# Patient Record
Sex: Female | Born: 1985 | Race: White | Hispanic: No | Marital: Single | Smoking: Current every day smoker
Health system: Southern US, Community
[De-identification: ages and names within clinical notes are randomized; demographics above are authoritative.]

---
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
## 2005-04-01 ENCOUNTER — Observation Stay: Payer: Self-pay | Admitting: Obstetrics & Gynecology

## 2005-07-22 ENCOUNTER — Inpatient Hospital Stay: Payer: Self-pay | Admitting: Obstetrics & Gynecology

## 2006-05-08 ENCOUNTER — Emergency Department: Payer: Self-pay | Admitting: Emergency Medicine

## 2006-10-08 ENCOUNTER — Inpatient Hospital Stay: Payer: Self-pay | Admitting: Unknown Physician Specialty

## 2007-08-16 ENCOUNTER — Other Ambulatory Visit: Payer: Self-pay

## 2007-08-16 ENCOUNTER — Emergency Department: Payer: Self-pay | Admitting: Emergency Medicine

## 2008-04-24 ENCOUNTER — Emergency Department: Payer: Self-pay | Admitting: Emergency Medicine

## 2009-11-12 ENCOUNTER — Emergency Department: Payer: Self-pay | Admitting: Unknown Physician Specialty

## 2010-11-24 ENCOUNTER — Ambulatory Visit: Payer: Self-pay | Admitting: Internal Medicine

## 2013-07-05 IMAGING — CT CT ABD-PELV W/ CM
1 of 2 series · 15 of 32 positions shown, 19 images · non-contrast
Comparison: none

REASON FOR EXAM: abd and pelv pain
COMMENTS:

[Series 2: abd with 5.0 i40f 3 · axial · 0.75mm/px · z∈[-278,+137]mm · 15 of 91 slices shown, 19 images]
[im 4/91  soft-tissue]
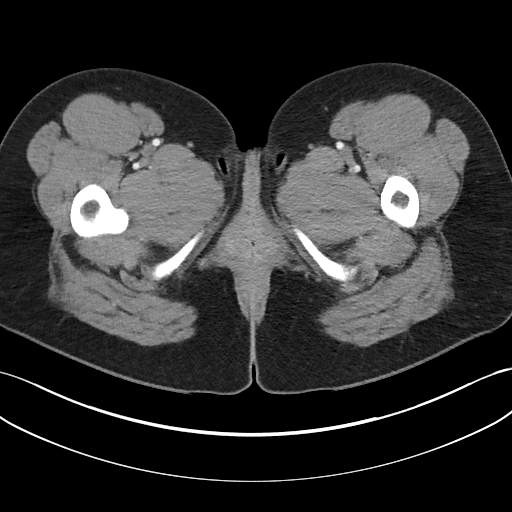
[im 4/91  bone]
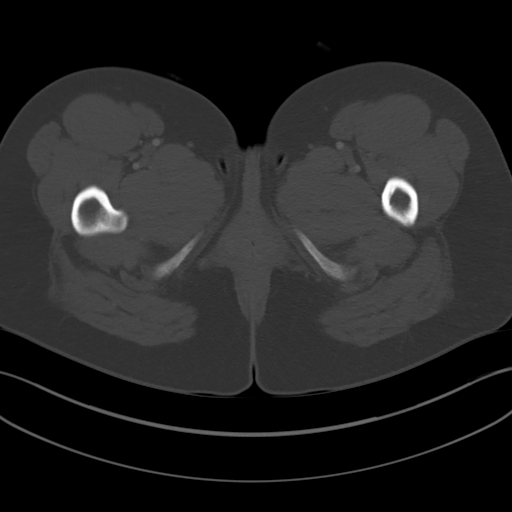
[im 11/91  soft-tissue]
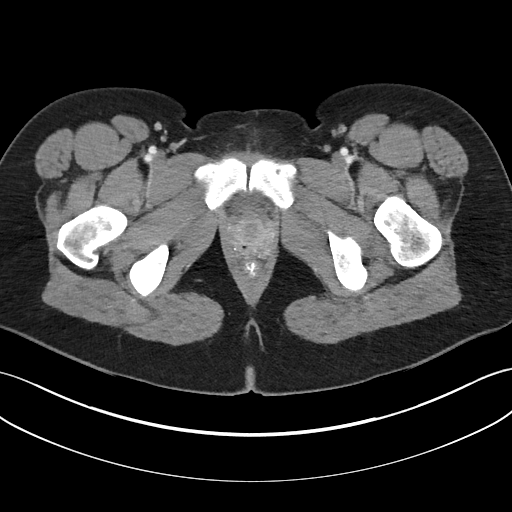
[im 19/91  soft-tissue]
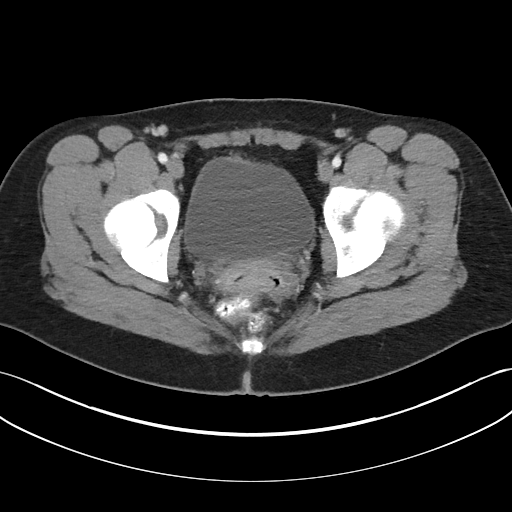
[im 26/91  soft-tissue]
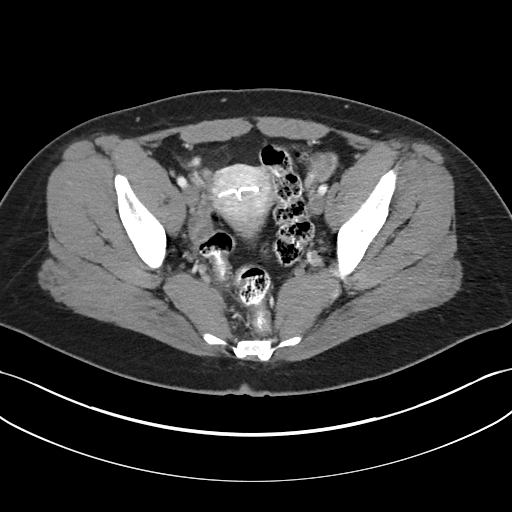
[im 33/91  soft-tissue]
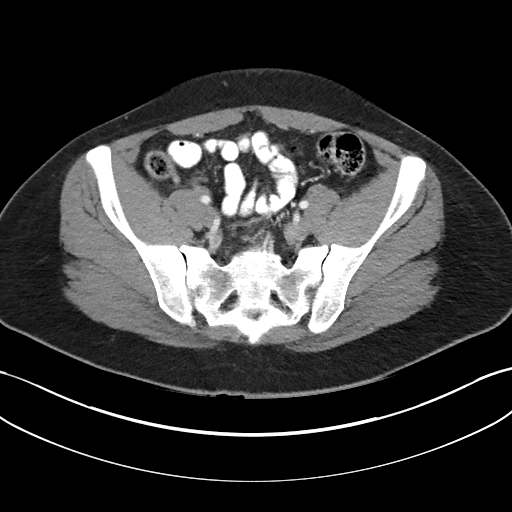
[im 40/91  soft-tissue]
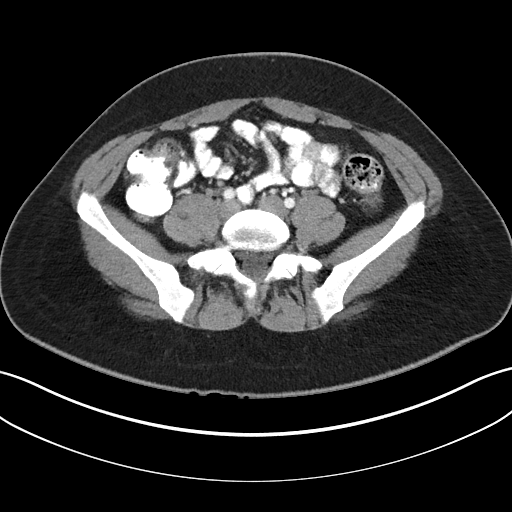
[im 47/91  soft-tissue]
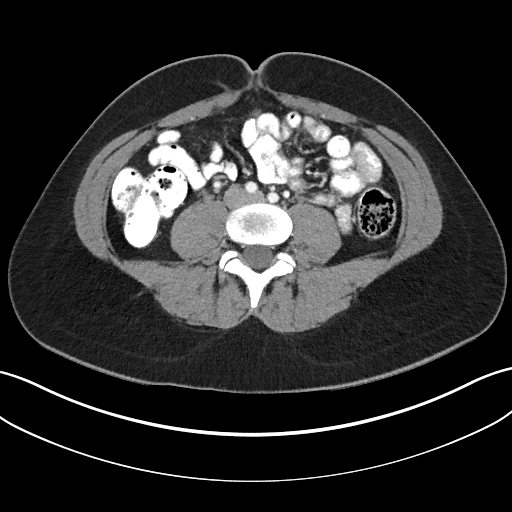
[im 51/91  soft-tissue]
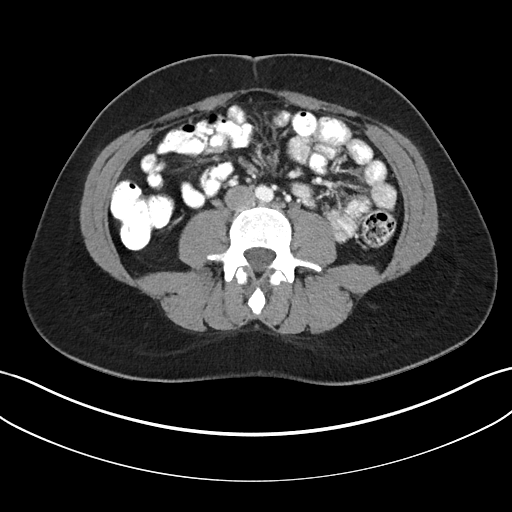
[im 58/91  soft-tissue]
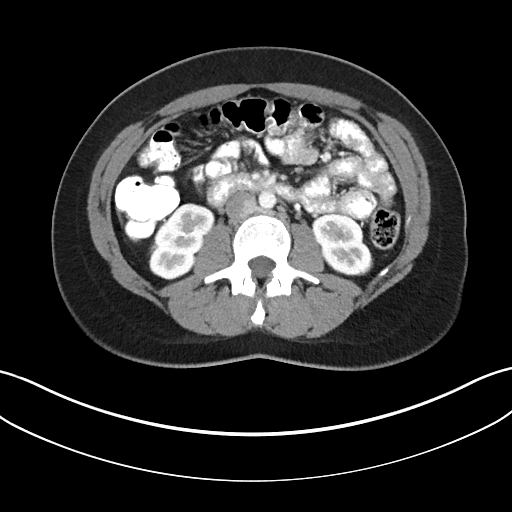
[im 58/91  bone]
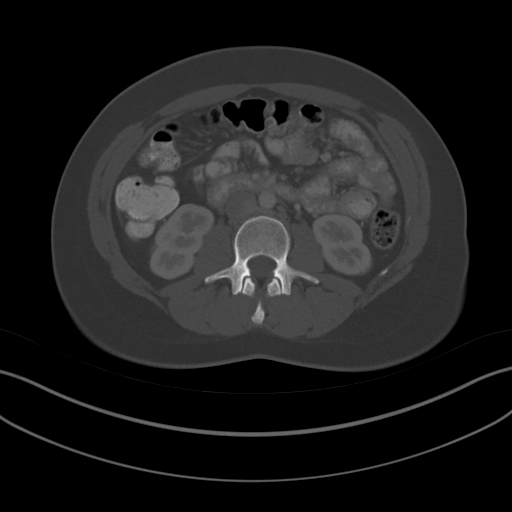
[im 65/91  soft-tissue]
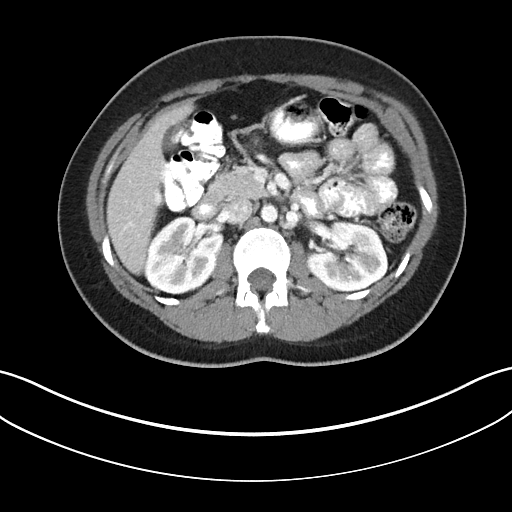
[im 73/91  soft-tissue]
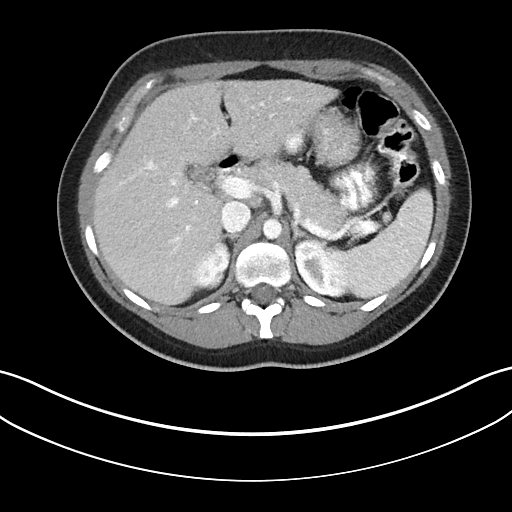
[im 76/91  lung]
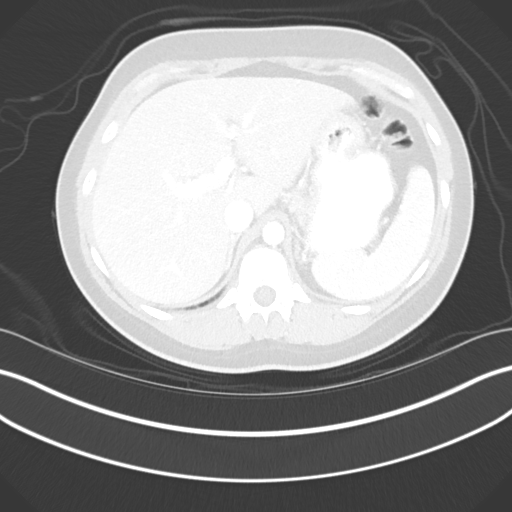
[im 80/91  soft-tissue]
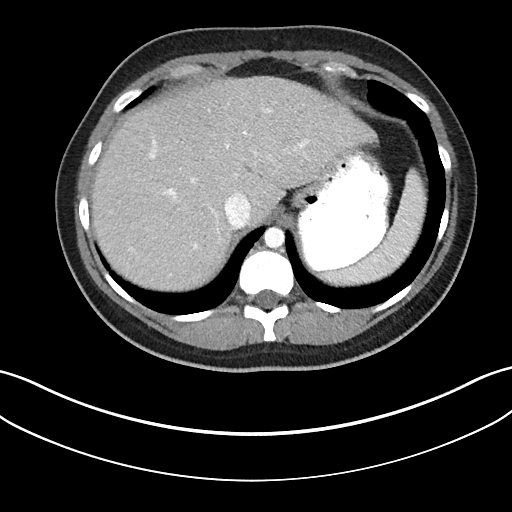
[im 80/91  lung]
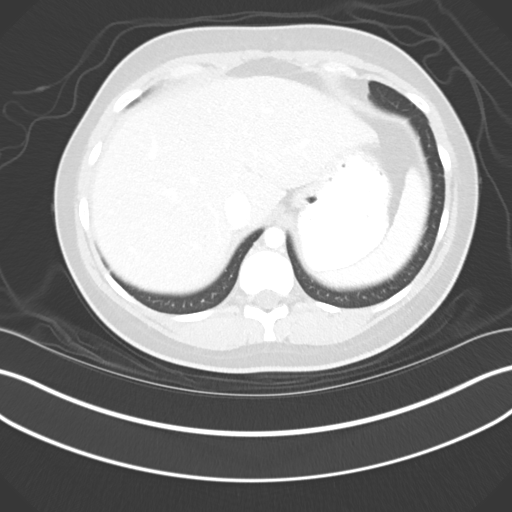
[im 83/91  lung]
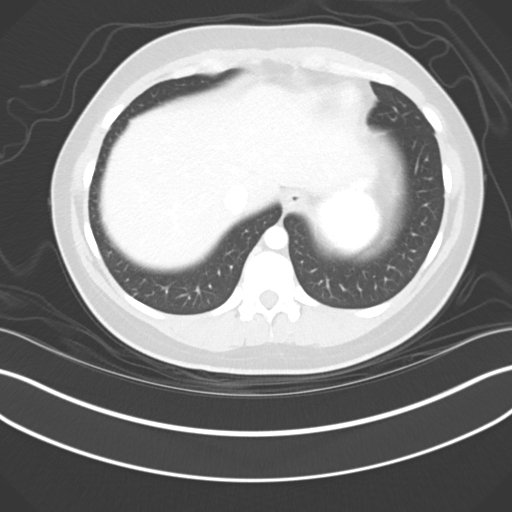
[im 87/91  soft-tissue]
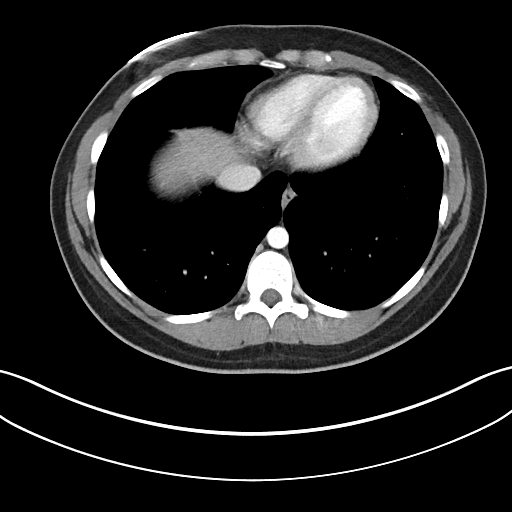
[im 87/91  lung]
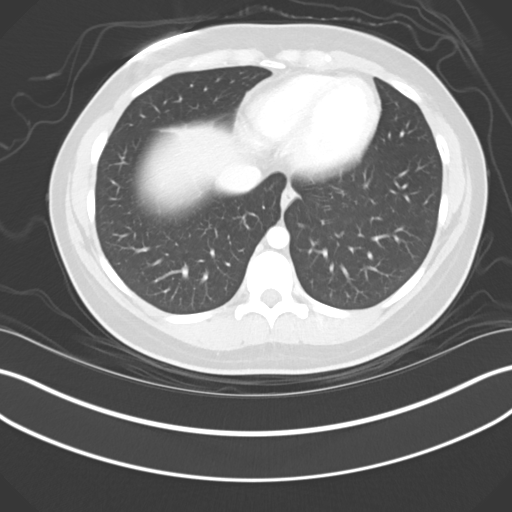

[15 of 32 positions shown; findings below may reference images not displayed]

PROCEDURE:     KCT - KCT ABDOMEN/PELVIS W  - November 24, 2010 [DATE]

RESULT:     CT of the abdomen and pelvis is performed with 85 mL of
Isovue-CMJ iodinated intravenous contrast and oral contrast. Images are
reconstructed at 5.0 mm slice thickness in the axial plane. There is no
previous exam for comparison.

Images through the base of the lungs demonstrate no discrete mass, pleural
effusion or pericardial effusion. The heart is unremarkable. The liver and
spleen appear to be within normal limits. The gallbladder shows no definite
stones. Pancreas appears unremarkable. The kidneys show no obstruction or
mass. The adrenal glands and abdominal aorta are within normal limits. There
is no abnormal bowel distention or bowel wall thickening. The uterus is
present with an intrauterine device within it. The urinary bladder is not
significantly distended. No adnexal or pelvic mass is evident. No ascites or
abnormal fluid collection or acute inflammatory process is appreciated. The
abdominal wall is intact.
IMPRESSION: 1.     No focal abnormality evident.
2.     The appendix appears normal.
3.     There is no abnormal bowel distention or wall thickening.

## 2015-09-17 ENCOUNTER — Ambulatory Visit (INDEPENDENT_AMBULATORY_CARE_PROVIDER_SITE_OTHER): Payer: BLUE CROSS/BLUE SHIELD

## 2015-09-17 ENCOUNTER — Ambulatory Visit
Admission: EM | Admit: 2015-09-17 | Discharge: 2015-09-17 | Disposition: A | Discharge status: Home / Self Care | Payer: BLUE CROSS/BLUE SHIELD | Attending: Family Medicine | Admitting: Family Medicine

## 2015-09-17 DIAGNOSIS — S9031XA Contusion of right foot, initial encounter: Secondary | ICD-10-CM

## 2015-09-17 NOTE — ED Provider Notes (Signed)
CSN: [id number]     Arrival date & time 09/17/15  1617 History   First MD Initiated Contact with Patient 09/17/15 1709     Chief Complaint  Patient presents with  . Foot Pain   (Consider location/radiation/quality/duration/timing/severity/associated sxs/prior Treatment) HPI  Is a 30 year old female who was practicing softball for a mixed league when she was struck by a line drive on the dorsum of her right foot. States that it swelled been difficult to walk on. She kept putting off being seen hoping to get better but she inadvertently dropped her phone on her foot today realizing that the pain was still very real and tender. Initial injury occurred 3 days ago. He has been using ice and elevation along with ibuprofen. He is able to walk on her foot but tends to walk on the lateral part foot    History reviewed. No pertinent past medical history. Past Surgical History:  Procedure Laterality Date  . CESAREAN SECTION     x2   No family history on file. Social History  Substance Use Topics  . Smoking status: Current Every Day Smoker  . Smokeless tobacco: Never Used  . Alcohol use No   OB History    No data available     Review of Systems  Constitutional: Positive for activity change. Negative for chills, fatigue and fever.  Skin: Negative for color change and wound.  All other systems reviewed and are negative.   Allergies  Review of patient's allergies indicates no known allergies.  Home Medications   Prior to Admission medications   Not on File   Meds Ordered and Administered this Visit  Medications - No data to display  BP 116/65 (BP Location: Left Arm)   Pulse 82   Temp 98.2 F (36.8 C) (Oral)   Resp 16   Ht 5\' 4"  (1.626 m)   Wt 180 lb (81.6 kg)   LMP 08/11/2015 (Exact Date) Comment: nuva ring  SpO2 99%   BMI 30.90 kg/m  No data found.   Physical Exam  Constitutional: She is oriented to person, place, and time. She appears well-developed and  well-nourished. No distress.  HENT:  Head: Normocephalic and atraumatic.  Eyes: EOM are normal. Pupils are equal, round, and reactive to light.  Neck: Normal range of motion. Neck supple.  Musculoskeletal: She exhibits tenderness. She exhibits no edema or deformity.  Examination the right foot shows no significant swelling erythema or ecchymosis. The patient has exquisite  tenderness to light touch along the first second and third metatarsals from the metatarsal tarsal border to approximately mid foot. Sensation is intact to light touch. Ankle range of motion is intact fully as are the toe range of motion  Neurological: She is alert and oriented to person, place, and time. She has normal reflexes.  Skin: Skin is warm and dry. She is not diaphoretic.  Psychiatric: She has a normal mood and affect. Her behavior is normal. Judgment and thought content normal.  Nursing note and vitals reviewed.   Urgent Care Course   Clinical Course    Procedures (including critical care time)  Labs Review Labs Reviewed - No data to display  Imaging Review Dg Foot 2 Views Right  Result Date: 09/17/2015 CLINICAL DATA:  Status post trauma to the anterior medial portion of the right foot with pain. EXAM: RIGHT FOOT - 2 VIEW COMPARISON:  None. FINDINGS: There is no evidence of fracture or dislocation. There is no evidence of arthropathy or other focal bone  abnormality. Soft tissues are unremarkable. IMPRESSION: Negative. Electronically Signed   By: Ted Mcalpine M.D.   On: 09/17/2015 16:56    Visual Acuity Review  Right Eye Distance:   Left Eye Distance:   Bilateral Distance:    Right Eye Near:   Left Eye Near:    Bilateral Near:      She was given a boot orthosis   MDM   1. Foot contusion, right, initial encounter    New Prescriptions   No medications on file  Plan: 1. Test/x-ray results and diagnosis reviewed with patient 2. rx as per orders; risks, benefits, potential side effects  reviewed with patient 3. Recommend supportive treatment with Elevation and heat and ibuprofen as necessary for comfort. She can use her boot for comfort. 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 If she continues to have problems she should follow-up with her primary care physician 4. F/u prn if symptoms worsen or don't improve     Lutricia Feil, PA-C 09/17/15 1745

## 2015-09-17 NOTE — ED Triage Notes (Signed)
Pt states she was hit on the medial side of foot on Tuesday with a softball and has had pain and swelling since.Marland Kitchen

## 2015-09-17 NOTE — ED Notes (Signed)
Pt returned from xray

## 2018-04-28 IMAGING — CR DG FOOT 2V*R*
2 series · 2 of 2 positions shown · non-contrast
Comparison: None.

CLINICAL DATA: Status post trauma to the anterior medial portion of
the right foot with pain.

EXAM:
RIGHT FOOT - 2 VIEW

[foot ap]
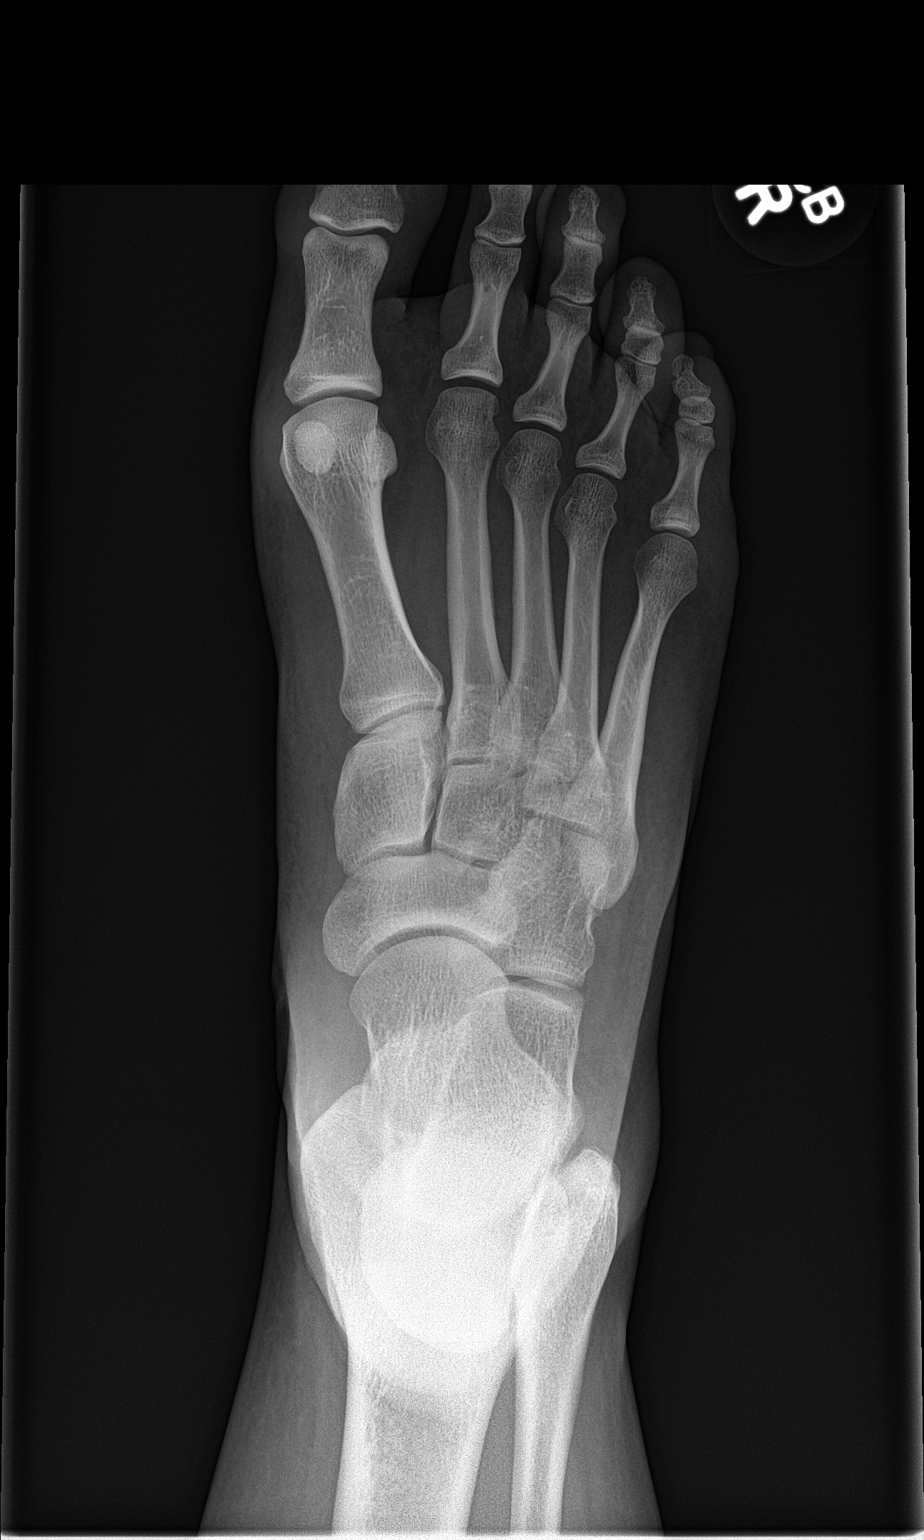

[foot lat]
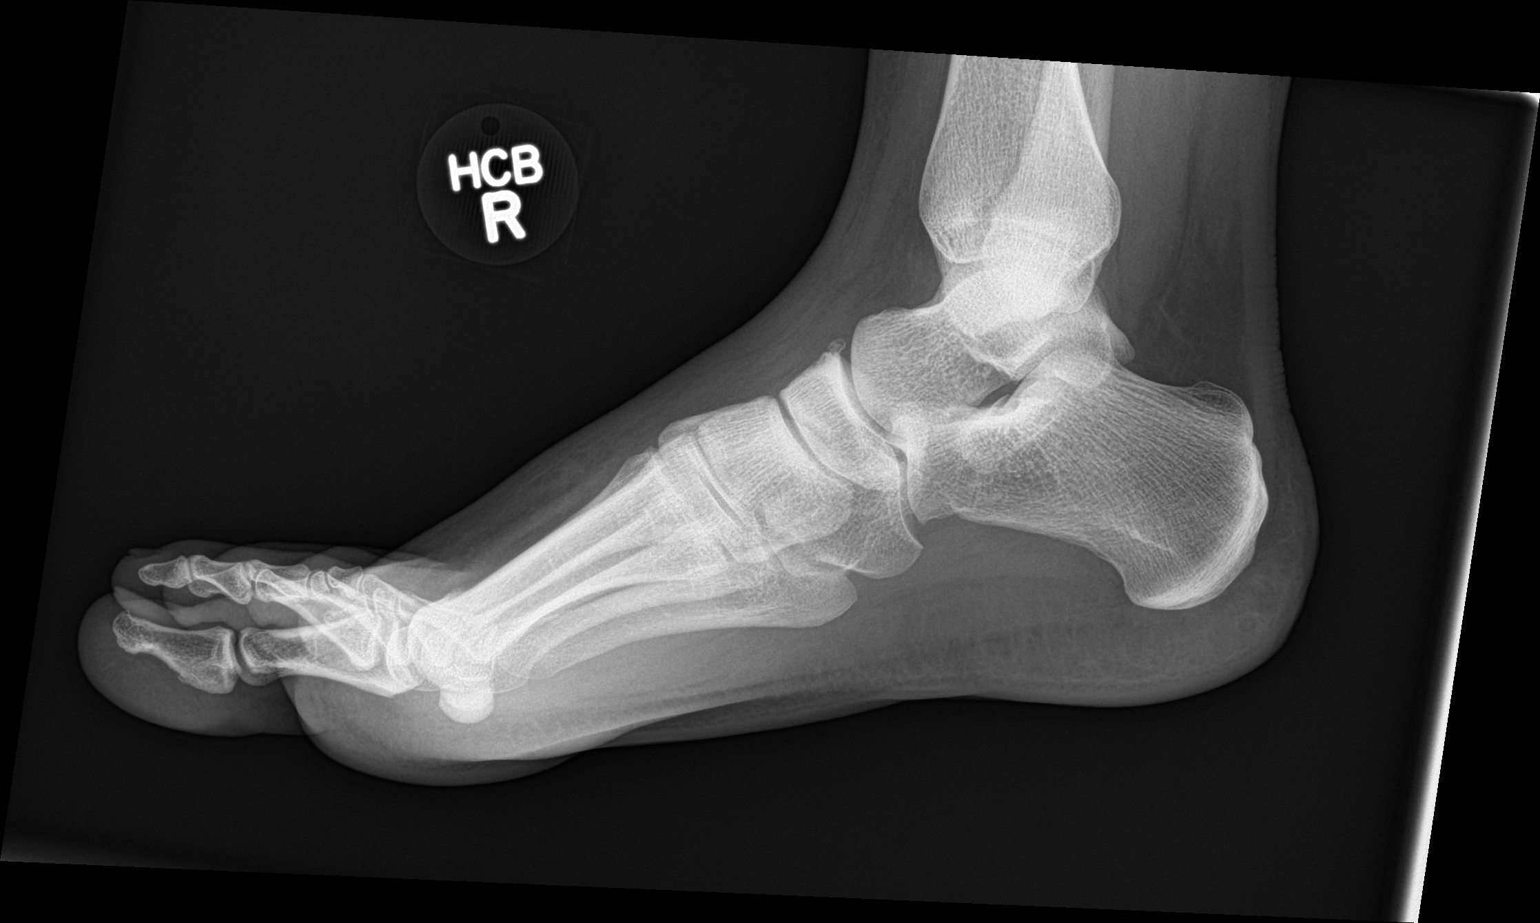

[2 of 2 positions shown; findings below may reference images not displayed]

FINDINGS: There is no evidence of fracture or dislocation. There is no
evidence of arthropathy or other focal bone abnormality. Soft
tissues are unremarkable.
IMPRESSION: Negative.
